# Patient Record
Sex: Male | Born: 1985 | Race: White | Hispanic: No | Marital: Married | Smoking: Current some day smoker
Health system: Southern US, Community
[De-identification: ages and names within clinical notes are randomized; demographics above are authoritative.]

## PROBLEM LIST (undated history)

## (undated) ENCOUNTER — Emergency Department (HOSPITAL_COMMUNITY): Admission: EM | Payer: Medicare HMO | Source: Home / Self Care

## (undated) DIAGNOSIS — S7290XA Unspecified fracture of unspecified femur, initial encounter for closed fracture: Secondary | ICD-10-CM

## (undated) HISTORY — PX: ANKLE SURGERY: SHX546

## (undated) HISTORY — PX: KNEE SURGERY: SHX244

## (undated) HISTORY — PX: SHOULDER SURGERY: SHX246

## (undated) HISTORY — PX: CRANIOTOMY: SHX93

---

## 2012-08-08 ENCOUNTER — Emergency Department (HOSPITAL_COMMUNITY): Payer: Medicare HMO

## 2012-08-08 ENCOUNTER — Emergency Department (HOSPITAL_COMMUNITY)
Admission: EM | Admit: 2012-08-08 | Discharge: 2012-08-08 | Disposition: A | Discharge status: Home / Self Care | Payer: Medicare HMO | Attending: Emergency Medicine | Admitting: Emergency Medicine

## 2012-08-08 DIAGNOSIS — R296 Repeated falls: Secondary | ICD-10-CM | POA: Insufficient documentation

## 2012-08-08 DIAGNOSIS — S43016A Anterior dislocation of unspecified humerus, initial encounter: Secondary | ICD-10-CM | POA: Insufficient documentation

## 2012-08-08 MED ORDER — FENTANYL CITRATE 0.05 MG/ML IJ SOLN
50.0000 ug | Freq: Once | INTRAMUSCULAR | Status: AC
  Administered 2012-08-08: 50 ug via INTRAVENOUS
  Filled 2012-08-08: qty 2

## 2012-08-08 MED ORDER — FENTANYL CITRATE 0.05 MG/ML IJ SOLN
100.0000 ug | Freq: Once | INTRAMUSCULAR | Status: AC
  Administered 2012-08-08: 100 ug via INTRAVENOUS
  Filled 2012-08-08: qty 2

## 2012-08-08 MED ORDER — OXYCODONE-ACETAMINOPHEN 5-325 MG PO TABS: 1.0000 | ORAL_TABLET | Freq: Four times a day (QID) | ORAL | Status: DC | PRN

## 2012-08-08 MED ORDER — ONDANSETRON HCL 4 MG/2ML IJ SOLN
4.0000 mg | Freq: Once | INTRAMUSCULAR | Status: AC
  Administered 2012-08-08: 4 mg via INTRAVENOUS
  Filled 2012-08-08: qty 2

## 2012-08-08 MED ORDER — PROPOFOL 10 MG/ML IV BOLUS
0.5000 mg/kg | Freq: Once | INTRAVENOUS | Status: AC
  Administered 2012-08-08: 95 mg via INTRAVENOUS
  Filled 2012-08-08: qty 1

## 2012-08-08 MED ORDER — HYDROMORPHONE HCL PF 1 MG/ML IJ SOLN
1.0000 mg | Freq: Once | INTRAMUSCULAR | Status: AC
  Administered 2012-08-08: 1 mg via INTRAVENOUS
  Filled 2012-08-08: qty 1

## 2012-08-08 MED ORDER — IBUPROFEN 800 MG PO TABS: 800.0000 mg | ORAL_TABLET | Freq: Three times a day (TID) | ORAL | Status: DC

## 2012-08-08 MED ORDER — KETOROLAC TROMETHAMINE 30 MG/ML IJ SOLN
30.0000 mg | Freq: Once | INTRAMUSCULAR | Status: AC
  Administered 2012-08-08: 30 mg via INTRAVENOUS
  Filled 2012-08-08: qty 1

## 2012-08-08 NOTE — ED Notes (Signed)
30mg propofol given

## 2012-08-08 NOTE — ED Notes (Signed)
65mg  porpofol given

## 2012-08-08 NOTE — ED Notes (Signed)
Reduction completed. 

## 2012-08-08 NOTE — ED Notes (Signed)
Patient transported to X-ray 

## 2012-08-08 NOTE — ED Provider Notes (Signed)
History     CSN: [id number]  Arrival date & time 08/08/12  4098   First MD Initiated Contact with Patient 08/08/12 2010      Chief Complaint  Patient presents with  . Shoulder Injury    (Consider location/radiation/quality/duration/timing/severity/associated sxs/prior treatment) HPI  Presents to emergency department with complaints of left shoulder pain. He states that he fell off of the end of the back head no when he caught himself falling to his left shoulder pop to have problems with shoulder and has had surgery to repair it a couple of years ago. He has not had any problems shoulder dislocating since then. The patient's blood pressure is 139/78 pulse 88. He is in pain but in no acute distress. Obvious deformity noted, no bleeding.  No past medical history on file.  No past surgical history on file.  No family history on file.  History  Substance Use Topics  . Smoking status: Not on file  . Smokeless tobacco: Not on file  . Alcohol Use: Not on file      Review of Systems   Review of Systems  Gen: no weight loss, fevers, chills, night sweats  Eyes: no discharge or drainage, no occular pain or visual changes  Nose: no epistaxis or rhinorrhea  Mouth: no dental pain, no sore throat  Neck: no neck pain  Lungs:No wheezing, coughing or hemoptysis CV: no chest pain, palpitations, dependent edema or orthopnea  Abd: no abdominal pain, nausea, vomiting  GU: no dysuria or gross hematuria  MSK:  Shoulder injury Neuro: no headache, no focal neurologic deficits  Skin: no abnormalities Psyche: negative.    Allergies  Review of patient's allergies indicates no known allergies.  Home Medications   Current Outpatient Rx  Name Route Sig Dispense Refill  . IBUPROFEN 800 MG PO TABS Oral Take 1 tablet (800 mg total) by mouth 3 (three) times daily. 21 tablet 0  . OXYCODONE-ACETAMINOPHEN 5-325 MG PO TABS Oral Take 1 tablet by mouth every 6 (six) hours as needed for pain. 15  tablet 0    BP 126/76  Pulse 65  Temp 98.5 F (36.9 C) (Oral)  Resp 16  Wt 150 lb (68.04 kg)  SpO2 100%  Physical Exam  Nursing note and vitals reviewed. Constitutional: He appears well-developed and well-nourished. No distress.  HENT:  Head: Normocephalic and atraumatic.  Eyes: Pupils are equal, round, and reactive to light.  Neck: Normal range of motion. Neck supple.  Cardiovascular: Normal rate and regular rhythm.   Pulmonary/Chest: Effort normal.  Abdominal: Soft.  Musculoskeletal:       Left shoulder: He exhibits decreased range of motion, tenderness, bony tenderness, swelling and deformity. He exhibits no effusion, no crepitus, no laceration, no pain, no spasm, normal pulse and normal strength.  Neurological: He is alert.  Skin: Skin is warm and dry.    ED Course  Procedures (including critical care time)  Labs Reviewed - No data to display Dg Shoulder Left  08/08/2012  *RADIOLOGY REPORT*  Clinical Data: Left shoulder injury  LEFT SHOULDER - 2+ VIEW  Comparison: None.  Findings: Anterior/inferior left shoulder dislocation.  No fracture is seen.  Visualized left lung is clear.  IMPRESSION: Anterior/inferior left shoulder dislocation.   Original Report Authenticated By: Charline Bills, M.D.    Dg Shoulder Left Port  08/08/2012  *RADIOLOGY REPORT*  Clinical Data: Post reduction  PORTABLE LEFT SHOULDER - 2+ VIEW  Comparison: 08/08/2012 at 2033 hours  Findings: The humeral head overlies the  glenoid on the frontal view, and is likely relocated.  Appropriate alignment is less clear on the attempted Y views, although that may be related to obliquity/difficulty with patient positioning.  Of note, the humeral head is mildly inferiorly displaced, which could reflect hemarthrosis/joint effusion.  No fracture is seen.  Visualized left lung is clear.  IMPRESSION: Humeral head is likely relocated but is inferiorly displaced, possibly related to hemarthrosis/joint effusion.   Original  Report Authenticated By: Charline Bills, M.D.      1. Anterior shoulder dislocation       MDM  Procedural sedation Performed by: Dorthula Matas Consent: Verbal consent obtained. Risks and benefits: risks, benefits and alternatives were discussed Required items: required blood products, implants, devices, and special equipment available Patient identity confirmed: arm band and provided demographic data Time out: Immediately prior to procedure a "time out" was called to verify the correct patient, procedure, equipment, support staff and site/side marked as required.  Sedation type: moderate (conscious) sedation NPO time confirmed and considedered  Sedatives: PROPOFOL  Physician Time at Bedside: 9:15pm  Vitals: Vital signs were monitored during sedation. Cardiac Monitor, pulse oximeter Patient tolerance: Patient tolerated the procedure well with no immediate complications. Comments: Pt with uneventful recovered. Returned to pre-procedural sedation baseline    Dr. Effie Shy and myself relocated patient shoulder without any complications. His pain is adequately treated in the emergency department. He recovered from the propofol without any complications. He is back to baseline now and is ready to go home. The patient is from [place] and has a orthopedic doctor there that did his previous surgery. He advises that he will call Monday morning for followup. He is placed in a shoulder immobilizer and given a prescription for Percocet and discharged home.  Pt has been advised of the symptoms that warrant their return to the ED. Patient has voiced understanding and has agreed to follow-up with the PCP or specialist.        Dorthula Matas, PA 08/08/12 2254  Dorthula Matas, PA 08/09/12 1610

## 2012-08-08 NOTE — ED Notes (Signed)
Pt sts slipped on backhoe pt caught self when falling. Pt heard shoulder pop.VSS.skin pwd

## 2012-08-09 NOTE — ED Provider Notes (Signed)
Medical screening examination/treatment/procedure(s) were conducted as a shared visit with non-physician practitioner(s) and myself.  I personally evaluated the patient during the encounter   . Recurrent left shoulder dislocation. He has a subacromial defect. He has no significant neurologic or vascular deficit.  Left shoulder dislocation, reduced under procedural sedation. He tolerated the procedure well. A shoulder immobilizer was applied.         Flint Melter, MD 08/09/12 [phone]

## 2014-04-23 ENCOUNTER — Encounter (HOSPITAL_COMMUNITY): Payer: Self-pay | Admitting: Emergency Medicine

## 2014-04-23 ENCOUNTER — Emergency Department (HOSPITAL_COMMUNITY)
Admission: EM | Admit: 2014-04-23 | Discharge: 2014-04-24 | Disposition: A | Discharge status: Home / Self Care | Payer: Self-pay | Attending: Emergency Medicine | Admitting: Emergency Medicine

## 2014-04-23 DIAGNOSIS — F17200 Nicotine dependence, unspecified, uncomplicated: Secondary | ICD-10-CM | POA: Insufficient documentation

## 2014-04-23 DIAGNOSIS — S43036A Inferior dislocation of unspecified humerus, initial encounter: Secondary | ICD-10-CM | POA: Insufficient documentation

## 2014-04-23 DIAGNOSIS — Y939 Activity, unspecified: Secondary | ICD-10-CM | POA: Insufficient documentation

## 2014-04-23 DIAGNOSIS — S43035A Inferior dislocation of left humerus, initial encounter: Secondary | ICD-10-CM

## 2014-04-23 MED ORDER — HYDROMORPHONE HCL PF 1 MG/ML IJ SOLN
1.0000 mg | Freq: Once | INTRAMUSCULAR | Status: AC
  Administered 2014-04-23: 1 mg via INTRAVENOUS
  Filled 2014-04-23: qty 1

## 2014-04-23 NOTE — ED Notes (Signed)
Per EMS pt was getting off city bus and loss balance catching himself with left arm; pt has deformity to left shoulder and c/o pain 10/10 in right hip; Pt denies LOC; A&Ox4; pt given Fent. For pain which has not helped;

## 2014-04-23 NOTE — ED Notes (Signed)
Per EMS, pt was getting off of the bus when he fell backwards and attempted to catch himself. Pt has obvious deformity to the left shoulder pt is experiencing numbness and tingling of this extremity. Pt denies LOC or hitting head. Pt also reports some pain to the right hip into the groin area. VSS. Pt received of fentanyl for pain.

## 2014-04-23 NOTE — ED Notes (Signed)
MD at bedside. 

## 2014-04-24 ENCOUNTER — Emergency Department (HOSPITAL_COMMUNITY): Payer: Medicare HMO

## 2014-04-24 ENCOUNTER — Emergency Department (HOSPITAL_COMMUNITY)
Admission: EM | Admit: 2014-04-24 | Discharge: 2014-04-24 | Disposition: A | Discharge status: Home / Self Care | Payer: Medicare HMO | Attending: Emergency Medicine | Admitting: Emergency Medicine

## 2014-04-24 ENCOUNTER — Encounter (HOSPITAL_COMMUNITY): Payer: Self-pay | Admitting: Emergency Medicine

## 2014-04-24 ENCOUNTER — Emergency Department (HOSPITAL_COMMUNITY): Payer: Self-pay

## 2014-04-24 DIAGNOSIS — Y939 Activity, unspecified: Secondary | ICD-10-CM | POA: Insufficient documentation

## 2014-04-24 DIAGNOSIS — S43036A Inferior dislocation of unspecified humerus, initial encounter: Secondary | ICD-10-CM | POA: Insufficient documentation

## 2014-04-24 DIAGNOSIS — X58XXXA Exposure to other specified factors, initial encounter: Secondary | ICD-10-CM | POA: Insufficient documentation

## 2014-04-24 DIAGNOSIS — F17200 Nicotine dependence, unspecified, uncomplicated: Secondary | ICD-10-CM | POA: Insufficient documentation

## 2014-04-24 DIAGNOSIS — S43002A Unspecified subluxation of left shoulder joint, initial encounter: Secondary | ICD-10-CM

## 2014-04-24 DIAGNOSIS — M25512 Pain in left shoulder: Secondary | ICD-10-CM

## 2014-04-24 DIAGNOSIS — Y929 Unspecified place or not applicable: Secondary | ICD-10-CM | POA: Insufficient documentation

## 2014-04-24 MED ORDER — OXYCODONE HCL 5 MG PO TABS
10.0000 mg | ORAL_TABLET | Freq: Once | ORAL | Status: AC
  Administered 2014-04-24: 10 mg via ORAL
  Filled 2014-04-24: qty 2

## 2014-04-24 MED ORDER — PROPOFOL 10 MG/ML IV BOLUS
0.5000 mg/kg | Freq: Once | INTRAVENOUS | Status: DC
Start: 2014-04-24 — End: 2014-04-24
  Filled 2014-04-24: qty 20

## 2014-04-24 MED ORDER — PROPOFOL 10 MG/ML IV BOLUS
INTRAVENOUS | Status: AC | PRN
  Administered 2014-04-24: 50 mg via INTRAVENOUS

## 2014-04-24 MED ORDER — OXYCODONE-ACETAMINOPHEN 5-325 MG PO TABS: 1.0000 | ORAL_TABLET | ORAL | Status: DC | PRN

## 2014-04-24 MED ORDER — OXYCODONE-ACETAMINOPHEN 5-325 MG PO TABS
2.0000 | ORAL_TABLET | Freq: Once | ORAL | Status: AC
  Administered 2014-04-24: 2 via ORAL
  Filled 2014-04-24: qty 2

## 2014-04-24 MED ORDER — HYDROCODONE-ACETAMINOPHEN 5-325 MG PO TABS: 1.0000 | ORAL_TABLET | ORAL | Status: DC | PRN

## 2014-04-24 MED ORDER — FENTANYL CITRATE 0.05 MG/ML IJ SOLN
75.0000 ug | Freq: Once | INTRAMUSCULAR | Status: AC
  Administered 2014-04-24: 75 ug via INTRAVENOUS
  Filled 2014-04-24: qty 2

## 2014-04-24 NOTE — ED Provider Notes (Signed)
CSN: [id number]     Arrival date & time 04/23/14  2312 History   First MD Initiated Contact with Patient 04/23/14 2333     Chief Complaint  Patient presents with  . Dislocation  . Fall     (Consider location/radiation/quality/duration/timing/severity/associated sxs/prior Treatment) HPI Comments: 28 year old male with smoking history, clavicle fracture, and left shoulder dislocation history presents with left shoulder pain after fall. Patient cut his foot while wearing crocs and landed on her left outstretched arm posteriorly. Patient felt a pop and pain in the shoulder and feels something is out of place. Pain with external and internal rotation. No head injury or loss of consciousness. No other significant injuries.  Patient is a 28 y.o. male presenting with fall. The history is provided by the patient.  Fall Pertinent negatives include no chest pain, no abdominal pain, no headaches and no shortness of breath.    History reviewed. No pertinent past medical history. History reviewed. No pertinent past surgical history. History reviewed. No pertinent family history. History  Substance Use Topics  . Smoking status: Current Some Day Smoker  . Smokeless tobacco: Not on file  . Alcohol Use: No    Review of Systems  Constitutional: Negative for fever and chills.  HENT: Negative for congestion.   Eyes: Negative for visual disturbance.  Respiratory: Negative for shortness of breath.   Cardiovascular: Negative for chest pain.  Gastrointestinal: Negative for vomiting and abdominal pain.  Genitourinary: Negative for dysuria and flank pain.  Musculoskeletal: Positive for arthralgias. Negative for back pain, neck pain and neck stiffness.  Skin: Negative for rash.  Neurological: Negative for light-headedness and headaches.      Allergies  Review of patient's allergies indicates no known allergies.  Home Medications   Prior to Admission medications   Not on File   BP 147/91  Pulse  69  Temp(Src) 97 F (36.1 C) (Oral)  Resp 18  SpO2 96% Physical Exam  Nursing note and vitals reviewed. Constitutional: He is oriented to person, place, and time. He appears well-developed and well-nourished.  HENT:  Head: Normocephalic and atraumatic.  Eyes: Conjunctivae are normal. Right eye exhibits no discharge. Left eye exhibits no discharge.  Neck: Normal range of motion. Neck supple. No tracheal deviation present.  Cardiovascular: Normal rate.   Pulmonary/Chest: Effort normal.  Abdominal: Soft.  Musculoskeletal: He exhibits no edema.  Patient has tenderness lateral and anterior shoulder with pain external and internal range of motion decreased range of motion similar. No signs of infection. Neurovascularly intact left arm. Left shoulder empty fossa.  Neurological: He is alert and oriented to person, place, and time.  Skin: Skin is warm. No rash noted.  Psychiatric: He has a normal mood and affect.    ED Course  Reduction of dislocation Date/Time: 04/24/2014 3:50 AM Performed by: Enid Skeens Authorized by: Enid Skeens Consent: Verbal consent obtained. written consent obtained. Consent given by: patient Patient identity confirmed: verbally with patient and arm band Preparation: Patient was prepped and draped in the usual sterile fashion. Patient sedated: yes Sedatives: propofol Vitals: Vital signs were monitored during sedation. Patient tolerance: Patient tolerated the procedure well with no immediate complications.   (including critical care time) Procedural sedation Performed by: Enid Skeens Consent: Verbal consent obtained. Risks and benefits: risks, benefits and alternatives were discussed Required items: required blood products, implants, devices, and special equipment available Patient identity confirmed: arm band and provided demographic data Time out: Immediately prior to procedure a "time out" was  called to verify the correct patient, procedure,  equipment, support staff and site  Sedation type: moderate (conscious) sedation NPO time confirmed, risks discussed  Sedatives: Propofol   Physician Time at Bedside: 20 minutes  Vitals: Vital signs were monitored during sedation. Cardiac Monitor, pulse oximeter Patient tolerance: Patient tolerated the procedure well with no immediate complications. Comments: Pt with uneventful recovered. Returned to pre-procedural sedation baseline  Procedural sedation Performed by: Enid SkeensZAVITZ, Mikalia Fessel M Consent: Verbal consent obtained. Risks and benefits: risks, benefits and alternatives were discussed Required items: required blood products, implants, devices, and special equipment available Patient identity confirmed: arm band and provided demographic data Time out: Immediately prior to procedure a "time out" was called to verify the correct patient, procedure, equipment, support staff and site  Sedation type: moderate (conscious) sedation NPO time confirmed, risks discussed  Sedatives: Propofol   Physician Time at Bedside: 15 minutes  Vitals: Vital signs were monitored during sedation. Cardiac Monitor, pulse oximeter Patient tolerance: Patient tolerated the procedure well with no immediate complications. Comments: Pt with uneventful recovered. Returned to pre-procedural sedation baseline  Ultrasound bedside limited musculoskeletal left shoulder Indication dislocation and pain Findings head of left humerus out of socket with empty fossa and after reduction head of humerus and fossa temporarily until self redislocated with rolling patient. Images saved Performed by myself    Labs Review Labs Reviewed - No data to display  Imaging Review No results found.   EKG Interpretation None      MDM   Final diagnoses:  Inferior dislocation of left shoulder   there is Concern for shoulder dislocation. Pain medicines IV and x-ray pending.  X-ray reviewed with inferior dislocation. Repeat IV  pain medicines given pain improved on recheck. Propofol utilized and initial reduction attempt performed however post x-ray showed still inferior dislocated reviewed. Again discussed risks and benefits of procedural sedation reduction attempt and patient agreed. Released again and different technique used with more traction countertraction and clinically shoulder reduced and ultrasound verified. Initial x-ray showed improvement however patient rolled and second x-ray and clinically shoulder appeared to be redislocated.  Patient had normal distal pulses and sensation in left extremity after each reduction attempt. Patient placed in a sling. Plan for very x-ray and consult orthopedic surgery.  Orthopedic surgeon came in to the ER to assist and evaluate the patient. He agrees that the patient's shoulders unstable and he was able to relocate and dislocate during his exam. Patient has an orthopedic surgeon in [place] and will followup there. Sling placed and pain meds for home.  Results and differential diagnosis were discussed with the patient/parent/guardian. Close follow up outpatient was discussed, comfortable with the plan.   Medications  propofol (DIPRIVAN) 10 mg/mL bolus/IV push 37.4 mg (not administered)  HYDROmorphone (DILAUDID) injection 1 mg (1 mg Intravenous Given 04/23/14 2346)  fentaNYL (SUBLIMAZE) injection 75 mcg (75 mcg Intravenous Given 04/24/14 0126)  propofol (DIPRIVAN) 10 mg/mL bolus/IV push ( Intravenous Stopped 04/24/14 0212)  propofol (DIPRIVAN) 10 mg/mL bolus/IV push ( Intravenous Stopped 04/24/14 0324)    Filed Vitals:   04/24/14 0445 04/24/14 0500 04/24/14 0515 04/24/14 0530  BP: 111/69 106/64 105/62 108/63  Pulse: 54 49 49 47  Temp:      TempSrc:      Resp: 17 11 13 13   Weight:      SpO2: 97% 97% 97% 98%        Enid SkeensJoshua M Aleasha Fregeau, MD 04/24/14 [phone]

## 2014-04-24 NOTE — ED Notes (Signed)
Patient transported to X-ray 

## 2014-04-24 NOTE — ED Notes (Signed)
Consent form at bedside.  

## 2014-04-24 NOTE — Discharge Instructions (Signed)
1. Medications: percocet, usual home medications 2. Treatment: rest, drink plenty of fluids, wear sling 3. Follow Up: Please followup with your orthopedist in [place] or [facility] for discussion of your diagnoses and further evaluation after today's visit; if you do not have a primary care doctor use the resource guide provided to find one;     Shoulder Dislocation Your shoulder is made up of three bones: the collar bone (clavicle); the shoulder blade (scapula), which includes the socket (glenoid cavity); and the upper arm bone (humerus). Your shoulder joint is the place where these bones meet. Strong, fibrous tissues hold these bones together (ligaments). Muscles and strong, fibrous tissues that connect the muscles to these bones (tendons) allow your arm to move through this joint. The range of motion of your shoulder joint is more extensive than most of your other joints, and the glenoid cavity is very shallow. That is the reason that your shoulder joint is one of the most unstable joints in your body. It is far more prone to dislocation than your other joints. Shoulder dislocation is when your humerus is forced out of your shoulder joint. CAUSES Shoulder dislocation is caused by a forceful impact on your shoulder. This impact usually is from an injury, such as a sports injury or a fall. SYMPTOMS Symptoms of shoulder dislocation include:  Deformity of your shoulder.  Intense pain.  Inability to move your shoulder joint.  Numbness, weakness, or tingling around your shoulder joint (your neck or down your arm).  Bruising or swelling around your shoulder. DIAGNOSIS In order to diagnose a dislocated shoulder, your caregiver will perform a physical exam. Your caregiver also may have an X-ray exam done to see if you have any broken bones. Magnetic resonance imaging (MRI) is a procedure that sometimes is done to help your caregiver see any damage to the soft tissues around your shoulder, particularly  your rotator cuff tendons. Additionally, your caregiver also may have electromyography done to measure the electrical discharges produced in your muscles if you have signs or symptoms of nerve damage. TREATMENT A shoulder dislocation is treated by placing the humerus back in the joint (reduction). Your caregiver does this either manually (closed reduction), by moving your humerus back into the joint through manipulation, or through surgery (open reduction). When your humerus is back in place, severe pain should improve almost immediately. You also may need to have surgery if you have a weak shoulder joint or ligaments, and you have recurring shoulder dislocations, despite rehabilitation. In rare cases, surgery is necessary if your nerves or blood vessels are damaged during the dislocation. After your reduction, your arm will be placed in a shoulder immobilizer or sling to keep it from moving. Your caregiver will have you wear your shoulder immoblizer or sling for 3 days to 3 weeks, depending on how serious your dislocation is. When your shoulder immobilizer or sling is removed, your caregiver may prescribe physical therapy to help improve the range of motion in your shoulder joint. HOME CARE INSTRUCTIONS  The following measures can help to reduce pain and speed up the healing process:  Rest your injured joint. Do not move it. Avoid activities similar to the one that caused your injury.  Apply ice to your injured joint for the first day or two after your reduction or as directed by your caregiver. Applying ice helps to reduce inflammation and pain.  Put ice in a plastic bag.  Place a towel between your skin and the bag.  Leave the  ice on for 15-20 minutes at a time, every 2 hours while you are awake.  Exercise your hand by squeezing a soft ball. This helps to eliminate stiffness and swelling in your hand and wrist.  Take over-the-counter or prescription medicine for pain or discomfort as told by  your caregiver. SEEK IMMEDIATE MEDICAL CARE IF:   Your shoulder immobilizer or sling becomes damaged.  Your pain becomes worse rather than better.  You lose feeling in your arm or hand, or they become white and cold. MAKE SURE YOU:   Understand these instructions.  Will watch your condition.  Will get help right away if you are not doing well or get worse. Document Released: 07/30/2001 Document Revised: 01/27/2012 Document Reviewed: 08/25/2011 [facility] Patient Information 2014 [employer] [place].

## 2014-04-24 NOTE — ED Notes (Signed)
X-ray at bedside

## 2014-04-24 NOTE — ED Notes (Signed)
Pt. returned from XR. 

## 2014-04-24 NOTE — ED Notes (Signed)
Respiratory Tech at bedside.  

## 2014-04-24 NOTE — Discharge Instructions (Signed)
Followup with your local orthopedic surgeon. Take ibuprofen every 6 hours and use ice for pain. Keep shoulder in a sling until you see the specialist. For severe pain take norco or vicodin however realize they have the potential for addiction and it can make you sleepy and has tylenol in it.  No operating machinery while taking.  If you were given medicines take as directed.  If you are on coumadin or contraceptives realize their levels and effectiveness is altered by many different medicines.  If you have any reaction (rash, tongues swelling, other) to the medicines stop taking and see a physician.   Please follow up as directed and return to the ER or see a physician for new or worsening symptoms.  Thank you. Filed Vitals:   04/24/14 0445 04/24/14 0500 04/24/14 0515 04/24/14 0530  BP: 111/69 106/64 105/62 108/63  Pulse: 54 49 49 47  Temp:      TempSrc:      Resp: 17 11 13 13   Weight:      SpO2: 97% 97% 97% 98%    Shoulder Dislocation Your shoulder is made up of three bones: the collar bone (clavicle); the shoulder blade (scapula), which includes the socket (glenoid cavity); and the upper arm bone (humerus). Your shoulder joint is the place where these bones meet. Strong, fibrous tissues hold these bones together (ligaments). Muscles and strong, fibrous tissues that connect the muscles to these bones (tendons) allow your arm to move through this joint. The range of motion of your shoulder joint is more extensive than most of your other joints, and the glenoid cavity is very shallow. That is the reason that your shoulder joint is one of the most unstable joints in your body. It is far more prone to dislocation than your other joints. Shoulder dislocation is when your humerus is forced out of your shoulder joint. CAUSES Shoulder dislocation is caused by a forceful impact on your shoulder. This impact usually is from an injury, such as a sports injury or a fall. SYMPTOMS Symptoms of shoulder  dislocation include:  Deformity of your shoulder.  Intense pain.  Inability to move your shoulder joint.  Numbness, weakness, or tingling around your shoulder joint (your neck or down your arm).  Bruising or swelling around your shoulder. DIAGNOSIS In order to diagnose a dislocated shoulder, your caregiver will perform a physical exam. Your caregiver also may have an X-ray exam done to see if you have any broken bones. Magnetic resonance imaging (MRI) is a procedure that sometimes is done to help your caregiver see any damage to the soft tissues around your shoulder, particularly your rotator cuff tendons. Additionally, your caregiver also may have electromyography done to measure the electrical discharges produced in your muscles if you have signs or symptoms of nerve damage. TREATMENT A shoulder dislocation is treated by placing the humerus back in the joint (reduction). Your caregiver does this either manually (closed reduction), by moving your humerus back into the joint through manipulation, or through surgery (open reduction). When your humerus is back in place, severe pain should improve almost immediately. You also may need to have surgery if you have a weak shoulder joint or ligaments, and you have recurring shoulder dislocations, despite rehabilitation. In rare cases, surgery is necessary if your nerves or blood vessels are damaged during the dislocation. After your reduction, your arm will be placed in a shoulder immobilizer or sling to keep it from moving. Your caregiver will have you wear your shoulder immoblizer  or sling for 3 days to 3 weeks, depending on how serious your dislocation is. When your shoulder immobilizer or sling is removed, your caregiver may prescribe physical therapy to help improve the range of motion in your shoulder joint. HOME CARE INSTRUCTIONS  The following measures can help to reduce pain and speed up the healing process:  Rest your injured joint. Do not  move it. Avoid activities similar to the one that caused your injury.  Apply ice to your injured joint for the first day or two after your reduction or as directed by your caregiver. Applying ice helps to reduce inflammation and pain.  Put ice in a plastic bag.  Place a towel between your skin and the bag.  Leave the ice on for 15-20 minutes at a time, every 2 hours while you are awake.  Exercise your hand by squeezing a soft ball. This helps to eliminate stiffness and swelling in your hand and wrist.  Take over-the-counter or prescription medicine for pain or discomfort as told by your caregiver. SEEK IMMEDIATE MEDICAL CARE IF:   Your shoulder immobilizer or sling becomes damaged.  Your pain becomes worse rather than better.  You lose feeling in your arm or hand, or they become white and cold. MAKE SURE YOU:   Understand these instructions.  Will watch your condition.  Will get help right away if you are not doing well or get worse. Document Released: 07/30/2001 Document Revised: 01/27/2012 Document Reviewed: 08/25/2011 [facility] Patient Information 2014 [place], [place].

## 2014-04-24 NOTE — ED Notes (Signed)
Pt requesting to speak with charge RN.  Gloris Manchester, RN notified.

## 2014-04-24 NOTE — ED Notes (Signed)
Closed reduction unsuccessful; Will have procedure repeated; Ortho paged back to bedside

## 2014-04-24 NOTE — Consult Note (Signed)
Reason for Consult:  Left shoulder pain Referring Physician:  Dr. Jason Foster  Leonard Foster is an 28 y.o. male.  HPI:  28 y/o RHD male with PMH of multiple shoulder dislocations and surgery fell last night on his outstretched hand.  He underwent closed reduction in the ER by Dr. Jodi Foster under propofol sedation.  His post reduction films appear to show continued subluxation inferiorly.  He says the shoulder feels better but is still sore.  He denies f/c/n/v/wt loss.  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  He had a left shoulder reconstruction by Dr. Kandis Foster in [place] about 2 years ago.  PMH:  none PSH: as above  FH:  noncontrib.  Social History:  reports that he has been smoking.  He does not have any smokeless tobacco history on file. He reports that he does not drink alcohol. His drug history is not on file.  Allergies: No Known Allergies  Medications: I have reviewed the patient's current medications.  No results found for this or any previous visit (from the past 48 hour(s)).  Dg Shoulder Left  04/24/2014   CLINICAL DATA:  Clinical dislocation of the left shoulder; history of previous shoulder surgery.  EXAM: LEFT SHOULDER - 2+ VIEW  COMPARISON:  Portable shoulder series of August 08, 2012  FINDINGS: The humeral head is inferiorly positioned with respect to the glenoid. There is no acute fracture. There 2 metallic screws present through the inferior aspect of the glenoid. [facility] is intact. The observed portions of the left clavicle and upper left ribs are normal.  IMPRESSION: The patient has sustained inferior dislocation of the humeral head with respect to the glenoid.   Electronically Signed   By: Leonard  Foster   On: 04/24/2014 00:19   Dg Shoulder Left Port  04/24/2014   CLINICAL DATA:  Post reduction.  EXAM: PORTABLE LEFT SHOULDER - 2+ VIEW  COMPARISON:  Left shoulder radiograph April 24, 2014 at 2:29 a.m.  FINDINGS: Inferiorly subluxed left  humeral head on the Y view without frank dislocation. No fracture deformity; status post bony [place] surgical repair. No destructive bony lesions. Periarticular soft tissue planes are nonsuspicious.  IMPRESSION: Inferiorly subluxed humeral head without frank dislocation.   Electronically Signed   By: Leonard Metroourtnay  Foster   On: 04/24/2014 03:53   Dg Shoulder Left Port  04/24/2014   CLINICAL DATA:  Postreduction films.  EXAM: PORTABLE LEFT SHOULDER - 2+ VIEW  COMPARISON:  Earlier film, same date.  FINDINGS: Persistent dislocation. No acute fracture. Stable surgical changes.  IMPRESSION: Persistent dislocation.   Electronically Signed   By: Leonard ChampagneMark  Foster M.D.   On: 04/24/2014 02:43    ROS:  No recent f/c/nv/w/t loss PE:  Blood pressure 112/72, pulse 46, temperature 97 F (36.1 C), temperature source Oral, resp. rate 14, weight 74.844 kg (165 lb), SpO2 97.00%. wn wd male in nad.  A and O x 4.  Mood and affect normal.  EOMi.  Resp unlabored.  L shoulder with healed surgical incisions anteriorly.  No lymphadenoapthy.  No sulcus sign.  Symmetric appearance to the shoulder as compared to the R Shoulder.  2+ radial pulse.  Feels LT in the radial, median and ulnar nerve dist.  Subjective decreased sens to LT in the radial n dist.  Axillary n sens to LT intact.  5/5 strength in grip.  I can passively abduct the shoulder to 90 deg and ER to 90 without significant pain.  He has passive IR and ER that is "sore", but he stays in a reduced position.  I can recreate his sulcus sign with longitudinal traction.  My review of the post reduction x rays reveals subluxation of the glenoid inferiorly with previous bone block surgery at the ant-inf glenoid.  2 screws in place.    Assessment/Plan: L shoulder dislocation s/p remote reconstruction - In light of the patient's physical exam, I believe the patient is currently reduced.  He appears to have an unstable shoulder and has probably disrupted his surgical reconstruction.   I believe he can be safely discharged home in a sling for f/u with Leonard Foster when he returns home to [place].  He understands this plan and agrees.  D/w Dr. Jodi Mourning.  Leonard Foster 04/24/2014, 7:06 AM

## 2014-04-24 NOTE — ED Notes (Signed)
Pt states to this RN and Traci, Consulting civil engineer that he will leave this hospital and go to another hospital.  Pt states he will go to [facility].  This RN suggested if he is going to travel to another hospital he could consider going to a [facility] where his orthopedic surgeon is affiliated.  Pt seems unwilling to do that.  This RN discussed with Micheline Maze, MD the situation at hand.  Pain meds requested for pt.  Order for Percocet received.

## 2014-04-24 NOTE — ED Provider Notes (Signed)
CSN: [id number]     Arrival date & time 04/24/14  1720 History   First MD Initiated Contact with Patient 04/24/14 1836     Chief Complaint  Patient presents with  . Shoulder Injury     (Consider location/radiation/quality/duration/timing/severity/associated sxs/prior Treatment) Patient is a 28 y.o. male presenting with shoulder injury. The history is provided by the patient and medical records. No language interpreter was used.  Shoulder Injury Associated symptoms include arthralgias and joint swelling. Pertinent negatives include no abdominal pain, chest pain, chills, fever, headaches, nausea, neck pain, numbness or vomiting.    Leonard Foster is a 28 y.o. male  with a hx of left shoulder reconstruction presents to the Emergency Department complaining of acute, persistent left shoulder dislocation onset yesterday afternoon. Patient reports he was seen in [place], spine and the shoulder was "reduced" but that it continues to dislocate. He reports that he was seen by an orthopedic surgeon who told him he needed to followup with his initial orthopedic surgeon. He reports he cannot afford his pain medications and he does not know what to do. He reports when he moves his left arm he has paresthesias in the left pinky. He endorses decreased sensation to the left arm and continued pain in the left shoulder. Nothing seems to make his symptoms better or worse.  Pt denies fever, chills, headache, neck pain, chest pain shortness of breath, color change to the hands, weakness in the arms.     History reviewed. No pertinent past medical history. History reviewed. No pertinent past surgical history. No family history on file. History  Substance Use Topics  . Smoking status: Current Some Day Smoker  . Smokeless tobacco: Not on file  . Alcohol Use: No    Review of Systems  Constitutional: Negative for fever and chills.  Eyes: Negative for visual disturbance.  Respiratory: Negative for shortness of  breath.   Cardiovascular: Negative for chest pain.  Gastrointestinal: Negative for nausea, vomiting and abdominal pain.  Musculoskeletal: Positive for arthralgias and joint swelling. Negative for back pain, neck pain and neck stiffness.  Skin: Negative for color change and wound.  Allergic/Immunologic: Negative for immunocompromised state.  Neurological: Negative for light-headedness, numbness and headaches.  Hematological: Does not bruise/bleed easily.  Psychiatric/Behavioral: The patient is not nervous/anxious.   All other systems reviewed and are negative.     Allergies  Review of patient's allergies indicates no known allergies.  Home Medications   Prior to Admission medications   Medication Sig Start Date End Date Taking? Authorizing Provider  HYDROcodone-acetaminophen (NORCO) 5-325 MG per tablet Take 1-2 tablets by mouth every 4 (four) hours as needed. 04/24/14   Enid Skeens, MD  oxyCODONE-acetaminophen (PERCOCET/ROXICET) 5-325 MG per tablet Take 1 tablet by mouth every 4 (four) hours as needed for severe pain. May take 2 tablets PO q 6 hours for severe pain - Do not take with Tylenol as this tablet already contains tylenol 04/24/14   [place]pet[place] Elling, PA-C   BP 142/96  Pulse 56  Temp(Src) 98 F (36.7 C) (Oral)  Resp 20  SpO2 98% Physical Exam  Nursing note and vitals reviewed. Constitutional: He appears well-developed and well-nourished. No distress.  Awake, alert, nontoxic appearance  HENT:  Head: Normocephalic and atraumatic.  Mouth/Throat: Oropharynx is clear and moist. No oropharyngeal exudate.  Eyes: Conjunctivae are normal. No scleral icterus.  Neck: Normal range of motion. Neck supple.  Cardiovascular: Normal rate, regular rhythm, normal heart sounds and intact distal pulses.   Pulses:  Radial pulses are 2+ on the right side, and 2+ on the left side.  Capillary refill less than 3 seconds No cyanosis of the fingers Fingers warm to touch    Pulmonary/Chest: Effort normal and breath sounds normal. No respiratory distress. He has no wheezes.  Abdominal: Soft. Bowel sounds are normal. He exhibits no mass. There is no tenderness. There is no rebound and no guarding.  Musculoskeletal: He exhibits tenderness. He exhibits no edema.       Left shoulder: He exhibits decreased range of motion, tenderness, deformity, pain and decreased strength. He exhibits normal pulse.       Left elbow: Normal.       Left wrist: Normal.       Left hand: He exhibits normal range of motion, normal two-point discrimination, normal capillary refill, no deformity, no laceration and no swelling. Normal sensation noted. Decreased sensation is not present in the ulnar distribution, is not present in the medial redistribution and is not present in the radial distribution. Normal strength noted.  ROM: Decreased active range of motion of the left shoulder; palpable deformity; internal and external rotation to 90 with mild pain; positive sulcus sign with longitudinal traction   Neurological: He is alert. Coordination normal.  Sensation intact in the left radial, median and ulnar distributions; subjectively decreased sensation in the left ulnar distribution but able to distinguish sharp and dull Strength 5/5 grip strength in the left hand, 5/5 strength in flexion and extension of the elbow; no active range of motion or strength in the left shoulder due to pain  Skin: Skin is warm and dry. He is not diaphoretic.  No tenting of the skin  Psychiatric: His speech is normal. Judgment and thought content normal. His mood appears anxious. He is agitated. Cognition and memory are normal.    ED Course  Procedures (including critical care time) Labs Review Labs Reviewed - No data to display  Imaging Review Dg Shoulder Left  04/24/2014   CLINICAL DATA:  Left shoulder pain.  History of prior surgery.  EXAM: LEFT SHOULDER - 2+ VIEW  COMPARISON:  Plain films left shoulder  04/24/2014.  FINDINGS: Two screws are seen in the anterior aspect of the glenoid. The humeral head is inferiorly subluxed. The glenoid appears dysplastic. No fracture is identified.  IMPRESSION: Inferior subluxation of the humeral head may be secondary to joint effusion and/or joint instability. No fracture is identified.  Dysplastic glenoid.   Electronically Signed   By: Drusilla Kannerhomas  Dalessio M.D.   On: 04/24/2014 20:40     EKG Interpretation None      MDM   Final diagnoses:  Shoulder subluxation, left  Arthralgia of left shoulder region   Leonard Foster presents with persistently dislocated shoulder after numerous attempted reductions last night and evaluation by Dr. Victorino DikeHewitt.  Orthopedic surgery's note indicates possible disruption to the previous surgical stabilization.  Patient was to followup with his orthopedic surgeon in [place] reports she is no way to get there until his father comes from [place] tomorrow. We'll give by mouth pain control and obtain left shoulder film to assess degree of dislocation at this time as compared to last night.  9:59 PM Repeat x-ray of the left shoulder shows persistent subluxation, but no frank dislocation. Patient with passive range of motion without significant pain to the left shoulder.  Discussed these findings with the patient. Based on record review and patient's current exam do not feel like sedation and attempts to reduce the shoulder further is  in the patient's best interest.  Will give repeat pain control here in the emergency department.  I recommend close followup with patient orthopedic surgeon in [place] or [place].  Patient will be given pain medication prescription as needed.  It has been determined that no acute conditions requiring further emergency intervention are present at this time. The patient/guardian have been advised of the diagnosis and plan. We have discussed signs and symptoms that warrant return to the ED, such as changes or  worsening in symptoms such as coldness in the hands, loss of sensation or significant weakness in the left arm..   Vital signs are stable at discharge.   BP 142/96  Pulse 56  Temp(Src) 98 F (36.7 C) (Oral)  Resp 20  SpO2 98%  Patient/guardian has voiced understanding and agreed to follow-up with the PCP or specialist.      Dierdre Forth, PA-C 04/24/14 2203

## 2014-04-24 NOTE — ED Notes (Signed)
Ortho paged to bedside for procedure; pt connected to [facility] for monitoring

## 2014-04-24 NOTE — ED Notes (Signed)
Respiratory paged to bedside for closed reduction procedure

## 2014-04-24 NOTE — ED Notes (Signed)
Bed: WA17 Expected date:  Expected time:  Means of arrival:  Comments: Triage fast track

## 2014-04-24 NOTE — ED Notes (Signed)
Per Charge pt ok to stay in room til family arrives from [facility]; ETA around 12pm; discharge instructions has been read and given to pt at bedside; pt still needs to sign out once leaving

## 2014-04-24 NOTE — ED Notes (Addendum)
Pt states he was seen [facility] for dislocated shoulder yesterdy, pt states he was discharged with his shoulder dislocated. Pt here c/o left shoulder dislocation and pain. Pt states hand is beginning to go numb and change color. Pt is from [place] and family is coming tomorrow.

## 2014-04-25 NOTE — ED Provider Notes (Signed)
Medical screening examination/treatment/procedure(s) were performed by non-physician practitioner and as supervising physician I was immediately available for consultation/collaboration.  Atlantis Delong T Billyjack Trompeter, MD 04/25/14 0959 

## 2015-03-15 ENCOUNTER — Emergency Department (HOSPITAL_COMMUNITY)
Admission: EM | Admit: 2015-03-15 | Discharge: 2015-03-15 | Disposition: A | Discharge status: Home / Self Care | Payer: Medicare HMO | Attending: Emergency Medicine | Admitting: Emergency Medicine

## 2015-03-15 ENCOUNTER — Emergency Department (HOSPITAL_COMMUNITY): Payer: Medicare HMO

## 2015-03-15 ENCOUNTER — Encounter (HOSPITAL_COMMUNITY): Payer: Self-pay | Admitting: *Deleted

## 2015-03-15 DIAGNOSIS — W19XXXA Unspecified fall, initial encounter: Secondary | ICD-10-CM

## 2015-03-15 DIAGNOSIS — Y9389 Activity, other specified: Secondary | ICD-10-CM | POA: Insufficient documentation

## 2015-03-15 DIAGNOSIS — Y998 Other external cause status: Secondary | ICD-10-CM | POA: Insufficient documentation

## 2015-03-15 DIAGNOSIS — S7001XA Contusion of right hip, initial encounter: Secondary | ICD-10-CM | POA: Insufficient documentation

## 2015-03-15 DIAGNOSIS — W14XXXA Fall from tree, initial encounter: Secondary | ICD-10-CM | POA: Insufficient documentation

## 2015-03-15 DIAGNOSIS — Z79899 Other long term (current) drug therapy: Secondary | ICD-10-CM | POA: Insufficient documentation

## 2015-03-15 DIAGNOSIS — S43001D Unspecified subluxation of right shoulder joint, subsequent encounter: Secondary | ICD-10-CM

## 2015-03-15 DIAGNOSIS — Z720 Tobacco use: Secondary | ICD-10-CM | POA: Insufficient documentation

## 2015-03-15 DIAGNOSIS — Y9289 Other specified places as the place of occurrence of the external cause: Secondary | ICD-10-CM | POA: Insufficient documentation

## 2015-03-15 DIAGNOSIS — M87051 Idiopathic aseptic necrosis of right femur: Secondary | ICD-10-CM

## 2015-03-15 DIAGNOSIS — M8788 Other osteonecrosis, other site: Secondary | ICD-10-CM | POA: Insufficient documentation

## 2015-03-15 DIAGNOSIS — S43032D Inferior subluxation of left humerus, subsequent encounter: Secondary | ICD-10-CM | POA: Insufficient documentation

## 2015-03-15 DIAGNOSIS — R52 Pain, unspecified: Secondary | ICD-10-CM

## 2015-03-15 HISTORY — DX: Unspecified fracture of unspecified femur, initial encounter for closed fracture: S72.90XA

## 2015-03-15 MED ORDER — OXYCODONE-ACETAMINOPHEN 5-325 MG PO TABS
2.0000 | ORAL_TABLET | Freq: Once | ORAL | Status: AC
  Administered 2015-03-15: 2 via ORAL
  Filled 2015-03-15: qty 2

## 2015-03-15 MED ORDER — HYDROCODONE-ACETAMINOPHEN 5-325 MG PO TABS: 1.0000 | ORAL_TABLET | ORAL | Status: DC | PRN

## 2015-03-15 NOTE — Discharge Instructions (Signed)
Avascular Necrosis Avascular necrosis is a disease resulting from the temporary or permanent loss of the blood supply to the bones. Without blood, the bone tissue dies and causes the bone to become soft. If the process involves the bone near a joint, it may lead to collapse of the joint surface. This disease is also known as:  Osteonecrosis.  Aseptic necrosis.  Ischemic bone necrosis. Avascular necrosis most commonly affects the ends (epiphysis) of long bones. The femur, the bone extending from the knee joint to the hip joint, is the bone most commonly involved. The disease may affect 1 bone, more than 1 bone at the same time, more than 1 bone at different times. It affects men and women equally. Avascular necrosis occurs at any age. But it is more common between the ages of 56 and 47 years. SYMPTOMS  In early stages patients may not have any symptoms. But as the disease progresses, joint pain generally develops. At first there is pain when putting weight on the affected joint, and then when resting. Pain usually develops gradually. It may be mild or severe. As the disease progresses and the bone and surrounding joint surface collapses, pain may develop or increase dramatically. Pain may be severe enough to limit range of motion in the affected joint. The period of time between the first symptoms and loss of joint function is different for each patient. This can range from several months to more than a year. Disability depends on:  What part of the bone is affected.  How large an area is involved.  How effectively the bone repairs itself.  If other illnesses are present.  If you are being treated for cancer with medications (chemotherapy).  Radiation.  The cause of the avascular necrosis. DIAGNOSIS  The diagnosis of aseptic necrosis is usually made by:  Taking a history.  Doing an exam.  Taking X-rays. (If X-rays are normal, an MRI may be required.)  Sometimes further blood work and  specialized studies may be necessary. TREATMENT  Treatment for this disease is necessary to maintain joint function. If untreated, most patients will suffer severe pain and limitation in movement within 2 years. Several treatments are available that help prevent further bone and joint damage. They can also reduce pain. To determine the most appropriate treatment, the caregiver considers the following aspects of a patient's disease:  The age of the patient.  The stage of the disease (early or late).  The location and amount of bone affected. It may be a small or large area.  The underlying cause of avascular necrosis. The goals in treatment are to:  Improve the patient's use of the affected joint.  Stop further damage to the bone.  Improve bone and joint survival. Your caregiver may use one or more of the following treatments:  Reduced weight bearing. If avascular necrosis is diagnosed early, the caregiver may begin treatment by having the patient limit weight on the affected joint. The caregiver may recommend limiting activities or using crutches. In some cases, reduced weight bearing can slow the damage caused by the disease and permit natural healing. When combined with medication to reduce pain, reduced weight bearing can be an effective way to avoid or delay surgery for some patients. Most patients eventually will need surgery to reconstruct the joint.  Core decompression. Core decompression works best in people who are in the earliest stages of avascular necrosis, before the collapse of the joint. This procedure often can reduce pain and slow the progression  of bone and joint destruction in these patients. This surgical procedure removes the inner layer of bone, which:  Reduces pressure within the bone.  Increases blood flow to the bone.  Allows more blood vessels to form.  Reduces pain.  Osteotomy. This surgical procedure re-shapes the bone to reduce stress on the affected area  of the joint. There is a lengthy recovery period. The patient's activities are very limited for 3 to 12 months after an osteotomy. This procedure is most effective for younger patients with advanced avascular necrosis, and those with a large area of affected bone.  Bone Graft. A bone graft may be used to support a joint after core decompression. Bone grafting is surgery that transplants healthy bone from one part of the patient, such as the leg, to the diseased area. Sometimes the bone is taken with it's blood vessels which are attached to local blood vessels near the area of bone collapse. This is called a vascularized bone graft. There is a lengthy recovery period after a bone graft, usually from 6 to 12 months. This procedure is technically complex.  Arthroplasty. Arthroplasty is also known as total joint replacement. Total joint replacement is used in late-stage avascular necrosis, and when the joint is deformed. In this surgery, the diseased joint is replaced with artificial parts. It may be recommended for people who are not good candidates for other treatments, such as patients who may not do well with repeated attempts to preserve the joint. Various types of replacements are available, and patients should discuss specific needs with their caregiver. New treatments being tried include:  The use of medications.  Electrical stimulation.  Combination therapies to increase the growth of new bone and blood vessels. Document Released: 04/26/2002 Document Revised: 01/27/2012 Document Reviewed: 01/12/2014 [facility] Patient Information 2015 [employer]. This information is not intended to replace advice given to you by your health care provider. Make sure you discuss any questions you have with your health care provider. See dr Victorino DikeHewitt today

## 2015-03-15 NOTE — ED Notes (Signed)
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 Pt denies LOC - admits to rt hip pain, left shoulder and left rib pain.

## 2015-03-15 NOTE — ED Provider Notes (Signed)
CSN: [id number]     Arrival date & time 03/15/15  0039 History   First MD Initiated Contact with Patient 03/15/15 0533     Chief Complaint  Patient presents with  . Fall  . Hip Pain  . Shoulder Pain     (Consider location/radiation/quality/duration/timing/severity/associated sxs/prior Treatment) HPI Comments: Patient presents stating that he fell out of a tree approximately 10 AM yesterday.  He now states that he has right hip pain and left shoulder pain.  He has not taken any medication for his discomfort.  He has a history of previous left shoulder surgery.  Patient is a 29 y.o. male presenting with fall, hip pain, and shoulder pain. The history is provided by the patient.  Fall This is a recurrent problem. The current episode started yesterday. The problem occurs constantly. The problem has been unchanged. Associated symptoms include arthralgias. Pertinent negatives include no weakness. The symptoms are aggravated by exertion. He has tried nothing for the symptoms. The treatment provided no relief.  Hip Pain Associated symptoms include arthralgias. Pertinent negatives include no weakness.  Shoulder Pain   Past Medical History  Diagnosis Date  . Femur fracture    Past Surgical History  Procedure Laterality Date  . Knee surgery    . Craniotomy    . Ankle surgery    . Shoulder surgery     History reviewed. No pertinent family history. History  Substance Use Topics  . Smoking status: Current Some Day Smoker    Types: Cigarettes  . Smokeless tobacco: Not on file  . Alcohol Use: No    Review of Systems  Musculoskeletal: Positive for arthralgias.  Skin: Negative for wound.  Neurological: Negative for weakness.  All other systems reviewed and are negative.     Allergies  Ketamine and Zofran  Home Medications   Prior to Admission medications   Medication Sig Start Date End Date Taking? Authorizing Provider  acetaminophen (TYLENOL) 500 MG tablet Take 1,000 mg by  mouth every 6 (six) hours as needed for mild pain.   Yes Historical Provider, MD  CREATINE PO Take 1 packet by mouth daily as needed (supplement).   Yes Historical Provider, MD  HYDROcodone-acetaminophen (NORCO) 5-325 MG per tablet Take 1-2 tablets by mouth every 4 (four) hours as needed. Patient not taking: Reported on 03/15/2015 04/24/14   Blane Ohara, MD  oxyCODONE-acetaminophen (PERCOCET/ROXICET) 5-325 MG per tablet Take 1 tablet by mouth every 4 (four) hours as needed for severe pain. May take 2 tablets PO q 6 hours for severe pain - Do not take with Tylenol as this tablet already contains tylenol Patient not taking: Reported on 03/15/2015 04/24/14   Dahlia Client Muthersbaugh, PA-C   BP 136/82 mmHg  Pulse 109  Temp(Src) 97.8 F (36.6 C) (Oral)  Resp 16  Ht  (1.753 m)  Wt 145 lb (65.772 kg)  BMI 21.40 kg/m2  SpO2 99% Physical Exam  Constitutional: He appears well-developed and well-nourished.  HENT:  Head: Normocephalic.  Eyes: Pupils are equal, round, and reactive to light.  Neck: Normal range of motion.  Cardiovascular: Normal rate.   Pulmonary/Chest: Effort normal.  Musculoskeletal: He exhibits tenderness. He exhibits no edema.       Left shoulder: He exhibits decreased range of motion, effusion, deformity and pain. He exhibits no swelling, normal pulse and normal strength.       Arms: Neurological: He is alert.  Skin: Skin is warm.  Nursing note and vitals reviewed.   ED Course  Procedures (  including critical care time) Labs Review Labs Reviewed - No data to display  Imaging Review Dg Ribs Unilateral W/chest Left  03/15/2015   CLINICAL DATA:  29 year old male status post 10 foot fall from tree  EXAM: LEFT RIBS AND CHEST - 3+ VIEW  COMPARISON:  Prior radiographs of the left shoulder 04/24/2014  FINDINGS: Surgical changes of prior glenoid repair. The left humeral head is low lying and subluxed but similar in appearance compared to 04/24/2014. These changes are likely chronic.  The lungs are clear. Cardiac and mediastinal contours are within normal limits. No acute rib fracture.  IMPRESSION: 1. No acute cardiopulmonary process. 2. No acute rib fracture. 3. Similar appearance of inferior subluxation of the left humerus compared to 04/24/2014. In the setting of prior surgical repair of the inferior glenoid, these changes are favored to be chronic.   Electronically Signed   By: Malachy Moan M.D.   On: 03/15/2015 02:26   Dg Shoulder Left  03/15/2015   CLINICAL DATA:  Status post fall, with left shoulder pain. Initial encounter.  EXAM: LEFT SHOULDER - 2+ VIEW  COMPARISON:  Left shoulder radiographs performed 04/24/2014  FINDINGS: There is no evidence of fracture or dislocation. Two screws are seen in the inferior glenoid, as on the prior study. There is mild associated bony remodeling. The left humeral head is inferiorly subluxed, likely reflecting an underlying joint effusion, or possibly some degree of joint instability.  The acromioclavicular joint is unremarkable in appearance. No significant soft tissue abnormalities are seen. The visualized portions of the left lung are clear.  IMPRESSION: 1. No evidence of fracture or dislocation. 2. Inferior subluxation of the left humeral head is perhaps slightly worsened from the prior study, and likely reflects an underlying joint effusion, or possibly some degree of underlying chronic joint instability. Postoperative change noted at the inferior glenoid.   Electronically Signed   By: Roanna Raider M.D.   On: 03/15/2015 02:26   Dg Hip Unilat With Pelvis 2-3 Views Right  03/15/2015   CLINICAL DATA:  Acute onset of right hip pain, after falling 10 feet. Initial encounter.  EXAM: RIGHT HIP (WITH PELVIS) 2-3 VIEWS  COMPARISON:  None.  FINDINGS: There appears to be chronic avascular necrosis of the right femoral head, with partial collapse of the superior aspect of the femoral head. It is difficult to determine whether the partial collapse  reflects acute injury. No focal fracture lines are seen.  Both femoral heads are seated normally within their respective acetabula. The proximal right femur appears otherwise intact. No significant degenerative change is appreciated. The sacroiliac joints are unremarkable in appearance.  The visualized bowel gas pattern is grossly unremarkable in appearance.  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 It is difficult to determine whether the partial collapse reflects acute injury; would correlate with the chronicity of the patient's symptoms.   Electronically Signed   By: Roanna Raider M.D.   On: 03/15/2015 02:28     EKG Interpretation None     x-rays have been discussed with patient.  There is no difference in today's x-ray from comparison x-rays.  He will be placed in sling and referred back to his orthopedic doctor most recently orthopedic seen in [place], Dr. Victorino Dike Patient refused pain medication  MDM   Final diagnoses:  Fall, initial encounter  Contusion, hip, right, initial encounter  Avascular necrosis of bone of hip, right  Shoulder  subluxation, right, subsequent encounter         Earley FavorGail Sophia Cubero, NP 03/15/15 0543  Cathren LaineKevin Steinl, MD 03/19/15 [phone]

## 2017-08-05 ENCOUNTER — Emergency Department (HOSPITAL_COMMUNITY)
Admission: EM | Admit: 2017-08-05 | Discharge: 2017-08-05 | Disposition: A | Discharge status: Home / Self Care | Payer: Self-pay | Attending: Emergency Medicine | Admitting: Emergency Medicine

## 2017-08-05 ENCOUNTER — Encounter (HOSPITAL_COMMUNITY): Payer: Self-pay | Admitting: *Deleted

## 2017-08-05 ENCOUNTER — Emergency Department (HOSPITAL_COMMUNITY): Payer: Self-pay

## 2017-08-05 DIAGNOSIS — X500XXA Overexertion from strenuous movement or load, initial encounter: Secondary | ICD-10-CM | POA: Insufficient documentation

## 2017-08-05 DIAGNOSIS — Y999 Unspecified external cause status: Secondary | ICD-10-CM | POA: Insufficient documentation

## 2017-08-05 DIAGNOSIS — Z79899 Other long term (current) drug therapy: Secondary | ICD-10-CM | POA: Insufficient documentation

## 2017-08-05 DIAGNOSIS — Y9389 Activity, other specified: Secondary | ICD-10-CM | POA: Insufficient documentation

## 2017-08-05 DIAGNOSIS — S43005A Unspecified dislocation of left shoulder joint, initial encounter: Secondary | ICD-10-CM | POA: Insufficient documentation

## 2017-08-05 DIAGNOSIS — Y929 Unspecified place or not applicable: Secondary | ICD-10-CM | POA: Insufficient documentation

## 2017-08-05 DIAGNOSIS — F17210 Nicotine dependence, cigarettes, uncomplicated: Secondary | ICD-10-CM | POA: Insufficient documentation

## 2017-08-05 MED ORDER — LIDOCAINE HCL 2 % IJ SOLN
20.0000 mL | Freq: Once | INTRAMUSCULAR | Status: DC
  Filled 2017-08-05: qty 20

## 2017-08-05 MED ORDER — OXYCODONE-ACETAMINOPHEN 5-325 MG PO TABS
1.0000 | ORAL_TABLET | Freq: Once | ORAL | Status: AC
  Administered 2017-08-05: 1 via ORAL

## 2017-08-05 MED ORDER — PROPOFOL 10 MG/ML IV BOLUS
0.5000 mg/kg | Freq: Once | INTRAVENOUS | Status: AC
  Administered 2017-08-05: 36 mg via INTRAVENOUS
  Filled 2017-08-05: qty 20

## 2017-08-05 MED ORDER — OXYCODONE-ACETAMINOPHEN 5-325 MG PO TABS
ORAL_TABLET | ORAL | Status: AC
  Filled 2017-08-05: qty 1
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
## 2017-08-05 NOTE — ED Notes (Signed)
Ortho paged to apply shoulder sling

## 2017-08-05 NOTE — ED Provider Notes (Signed)
.  Sedation Date/Time: 08/05/2017 3:17 PM Performed by: Adela Lank Byanca Kasper Authorized by: Melene Plan   Consent:    Consent obtained:  Written   Consent given by:  Patient   Risks discussed:  Allergic reaction, inadequate sedation, nausea, vomiting, respiratory compromise necessitating ventilatory assistance and intubation and prolonged sedation necessitating reversal   Alternatives discussed:  Analgesia without sedation and regional anesthesia Indications:    Procedure performed:  Dislocation reduction   Procedure necessitating sedation performed by:  Different physician   Intended level of sedation:  Moderate (conscious sedation) Pre-sedation assessment:    Time since last food or drink:  6 hours   NPO status caution: urgency dictates proceeding with non-ideal NPO status     ASA classification: class 2 - patient with mild systemic disease     Neck mobility: normal     Mouth opening:  3 or more finger widths   Thyromental distance:  2 finger widths   Mallampati score:  I - soft palate, uvula, fauces, pillars visible   Pre-sedation assessments completed and reviewed: airway patency, cardiovascular function, hydration status, mental status, nausea/vomiting, pain level, respiratory function and temperature   Immediate pre-procedure details:    Reassessment: Patient reassessed immediately prior to procedure     Reviewed: vital signs, relevant labs/tests and NPO status     Verified: bag valve mask available, emergency equipment available, intubation equipment available, IV patency confirmed, oxygen available and suction available   Procedure details (see MAR for exact dosages):    Preoxygenation:  Nasal cannula   Sedation:  Propofol   Intra-procedure monitoring:  Blood pressure monitoring, cardiac monitor, continuous pulse oximetry, frequent LOC assessments and frequent vital sign checks   Intra-procedure events: none     Total Provider sedation time (minutes):  35 Post-procedure details:   Post-sedation assessment completed:  08/05/2017 3:18 PM   Attendance: Constant attendance by certified staff until patient recovered     Recovery: Patient returned to pre-procedure baseline     Post-sedation assessments completed and reviewed: airway patency, cardiovascular function, hydration status, mental status, nausea/vomiting, pain level, respiratory function and temperature     Patient is stable for discharge or admission: yes     Patient tolerance:  Tolerated well, no immediate complications   Medical screening examination/treatment/procedure(s) were conducted as a shared visit with non-physician practitioner(s) and myself.  I personally evaluated the patient during the encounter.   EKG Interpretation None        See the written copy of this report in the patient's paper medical record.  These results did not interface directly into the electronic medical record and are summarized here.  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 Patient has had 4 ED visits in 2 days time. Clinically appears to be dislocated. Pulse motor and sensation is intact distally on my exam. X-ray with likely subluxation as read by the radiologist. The patient is adamant that his shoulder is out of place. When the patient was sedated shoulder went back into place. I was present for the entire procedure.  I'm unsure if the patient was holding in this position for secondary gain or not. There is some discussion about pain seeking behavior in prior documentation. Patient was discharged in a shoulder immobilizer and referred to orthopedics.    Melene Plan, DO 08/05/17 1521

## 2017-08-05 NOTE — ED Provider Notes (Signed)
[facility]-EMERGENCY DEPT Provider Note   CSN: [id number] Arrival date & time: 08/05/17  0740    History   Chief Complaint Chief Complaint  Patient presents with  . Shoulder Pain    HPI Leonard Foster is a 31 y.o. male.  HPI  31 year old male presents today with complaints of bilateral shoulder pain.  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  He notes he underwent surgery for this but continues to have him in dislocations.  Patient notes he was swinging a baseball bat yesterday when his shoulder dislocated.  He notes minor decrease in sensation in his fingers, no loss of strength.  Patient was seen yesterday at [facility] review notes that he was flipping a mattress at rehabilitation facility that caused this dislocation.  He was reduced in the ED there.  Patient notes he returned to his rehabilitation facility, they told him his shoulder was still dislocated as he had a gap between the acromion process and upper shoulder.  He notes that they will not allow him back in rehabilitation and he will be going to jail as this violates his parole.  Patient reports that he does not want narcotic pain medication, reports he just wants the shoulder reduced.   Past Medical History:  Diagnosis Date  . Femur fracture (HCC)     There are no active problems to display for this patient.   Past Surgical History:  Procedure Laterality Date  . ANKLE SURGERY    . CRANIOTOMY    . KNEE SURGERY    . SHOULDER SURGERY       Home Medications    Prior to Admission medications   Medication Sig Start Date End Date Taking? Authorizing Provider  acetaminophen (TYLENOL) 500 MG tablet Take 1,000 mg by mouth every 6 (six) hours as needed for mild pain.   Yes [provider]    Family History No family history on file.  Social History Social History  Substance Use Topics  . Smoking status: Current Some Day Smoker    Types: Cigarettes  .  Smokeless tobacco: Not on file  . Alcohol use No    Allergies   Ketamine and Zofran [ondansetron hcl]   Review of Systems Review of Systems  All other systems reviewed and are negative.    Physical Exam Updated Vital Signs BP 116/90   Pulse (!) 58   Temp 98.6 F (37 C) (Oral)   Resp 10   Ht 6' (1.829 m)   Wt 72.6 kg (160 lb)   SpO2 100%   BMI 21.70 kg/m   Physical Exam  Constitutional: He is oriented to person, place, and time. He appears well-developed and well-nourished.  HENT:  Head: Normocephalic and atraumatic.  Eyes: Pupils are equal, round, and reactive to light. Conjunctivae are normal. Right eye exhibits no discharge. Left eye exhibits no discharge. No scleral icterus.  Neck: Normal range of motion. No JVD present. No tracheal deviation present.  Pulmonary/Chest: Effort normal. No stridor.  Musculoskeletal:  Obvious space between acromion process and humerus - grip strength 5/5 sensation reduced to hand along the ulnar aspect   Neurological: He is alert and oriented to person, place, and time. Coordination normal.  Psychiatric: He has a normal mood and affect. His behavior is normal. Judgment and thought content normal.  Nursing note and vitals reviewed.   ED Treatments / Results  Labs (all labs ordered are listed, but only abnormal results are displayed) Labs Reviewed -  No data to display  EKG  EKG Interpretation None       Radiology Dg Shoulder Right  Result Date: 08/05/2017 CLINICAL DATA:  Pain following injury while playing baseball EXAM: RIGHT SHOULDER - 2+ VIEW COMPARISON:  None. FINDINGS: Frontal, oblique, and Y scapular images were obtained. 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 No acute appearing fracture or dislocation evident. There is mild narrowing of the glenohumeral joint. The acromioclavicular joint appears within normal limits. Visualized  right lung is clear. 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 No acute appearing fracture or dislocation. There is mild narrowing of the glenohumeral joint. Electronically Signed   By: Bretta Bang III M.D.   On: 08/05/2017 08:31   Dg Shoulder Left  Result Date: 08/05/2017 CLINICAL DATA:  Pain following injury while playing baseball EXAM: LEFT SHOULDER - 2+ VIEW COMPARISON:  January 14, 2015 FINDINGS: Oblique and Y scapular images obtained. There is chronic appearing inferior subluxation of the left humeral head with respect to the glenoid, stable from prior study. There is not appear to be frank dislocation. No fracture evident. There are screws transfixing the inferior glenoid. There is no appreciable joint space narrowing or erosive change. Visualized left lung is clear. IMPRESSION: Chronic appearing inferior subluxation of the left humeral head with respect to the glenoid, essentially stable appearance compared to prior study. No frank dislocation. No fracture. No appreciable joint space narrowing or erosion. Postoperative change in the inferior glenoid. Electronically Signed   By: Bretta Bang III M.D.   On: 08/05/2017 08:30   Dg Shoulder Left Portable  Result Date: 08/05/2017 CLINICAL DATA:  Post reduction EXAM: LEFT SHOULDER - 1 VIEW COMPARISON:  Earlier today FINDINGS: There is now anatomic alignment of the humeral head with respect to the glenoid. Postop changes at the inferior glenoid are stable. No acute fracture. No dislocation. IMPRESSION: Anatomic reduction of the glenohumeral joint has been achieved. Electronically Signed   By: Jolaine Click M.D.   On: 08/05/2017 14:46    Procedures Procedures (including critical care time)  Reduction of dislocation Date/Time: 3:14 PM Performed by: Thermon Leyland Authorized by: Thermon Leyland Consent: Verbal consent obtained. Risks and benefits: risks, benefits and alternatives were  discussed Consent given by: patient Required items: required blood products, implants, devices, and special equipment available Time out: Immediately prior to procedure a "time out" was called to verify the correct patient, procedure, equipment, support staff and site/side marked as required.  Patient sedated: Patient sedated using propofol  Vitals: Vital signs were monitored during sedation. Patient tolerance: Patient tolerated the procedure well with no immediate complications. Joint: left shoulder   Reduction technique: Upon sedation joint was reduced likely secondary to muscular relaxation    SPLINT APPLICATION Date/Time: 3:14 PM Authorized by: Thermon Leyland Consent: Verbal consent obtained. Risks and benefits: risks, benefits and alternatives were discussed Consent given by: patient Splint applied by: orthopedic technician Location details: left shoudler Splint type: Sling immobilizer Supplies used: Sling immobilizer Post-procedure: The splinted body part was neurovascularly unchanged following the procedure. Patient tolerance: Patient tolerated the procedure well with no immediate complications.   Medications Ordered in ED Medications  lidocaine (XYLOCAINE) 2 % (with pres) injection 400 mg (400 mg Infiltration Refused 08/05/17 1441)  oxyCODONE-acetaminophen (PERCOCET/ROXICET) 5-325 MG per tablet 1 tablet (1 tablet Oral Given 08/05/17 0754)  propofol (DIPRIVAN) 10 mg/mL bolus/IV push 36.3 mg (36 mg  Intravenous Given 08/05/17 1417)     Initial Impression / Assessment and Plan / ED Course  I have reviewed the triage vital signs and the nursing notes.  Pertinent labs & imaging results that were available during my care of the patient were reviewed by me and considered in my medical decision making (see chart for details).      Final Clinical Impressions(s) / ED Diagnoses   Final diagnoses:  Dislocation of left shoulder joint, initial encounter    Imaging: DG  shoulder right, DG shoulder left   Therapeutics: Propofol  Discharge Meds:   Assessment/Plan: 31 year old male presents today with shoulder dislocation.  Imaging shows inferior subluxation, clinically he has a large defect At the acromium and humeral head with reported numbness in his hand.  Attempted reduction will be performed here in the ED.    New Prescriptions New Prescriptions   No medications on file     Rosalio Loud 08/05/17 1514    Melene Plan, DO 08/05/17 1521

## 2017-08-05 NOTE — Progress Notes (Signed)
Orthopedic Tech Progress Note Patient Details:  Leonard Foster 28-Oct-1986 [id number]      [name] 08/05/2017, 3:10 PM

## 2017-08-05 NOTE — Discharge Instructions (Signed)
Please read attached information. If you experience any new or worsening signs or symptoms please return to the emergency room for evaluation. Please follow-up with your primary care provider or specialist as discussed.  °

## 2018-03-04 ENCOUNTER — Emergency Department: Admit: 2018-03-04 | Payer: TRICARE (CHAMPUS)

## 2018-03-04 ENCOUNTER — Inpatient Hospital Stay
Admit: 2018-03-04 | Discharge: 2018-03-04 | Disposition: A | Discharge status: Home / Self Care | Payer: TRICARE (CHAMPUS) | Attending: Emergency Medicine

## 2018-03-04 DIAGNOSIS — M25512 Pain in left shoulder: Secondary | ICD-10-CM

## 2018-03-04 MED ORDER — ONDANSETRON (PF) 4 MG/2 ML INJECTION
4 mg/2 mL | INTRAMUSCULAR | Status: AC
Start: 2018-03-04 — End: 2018-03-04
  Administered 2018-03-04: 08:00:00 via INTRAVENOUS

## 2018-03-04 MED ORDER — NAPROXEN 500 MG TAB
500 mg | ORAL_TABLET | Freq: Two times a day (BID) | ORAL | 0 refills | Status: AC
Start: 2018-03-04 — End: 2018-03-14

## 2018-03-04 MED ORDER — KETOROLAC TROMETHAMINE 30 MG/ML INJECTION
30 mg/mL (1 mL) | INTRAMUSCULAR | Status: AC
Start: 2018-03-04 — End: 2018-03-04
  Administered 2018-03-04: 10:00:00 via INTRAVENOUS

## 2018-03-04 MED ORDER — HYDROMORPHONE (PF) 1 MG/ML IJ SOLN
1 mg/mL | INTRAMUSCULAR | Status: AC
Start: 2018-03-04 — End: 2018-03-04
  Administered 2018-03-04: 09:00:00 via INTRAVENOUS

## 2018-03-04 MED ORDER — HYDROMORPHONE (PF) 1 MG/ML IJ SOLN
1 mg/mL | INTRAMUSCULAR | Status: AC
Start: 2018-03-04 — End: 2018-03-04
  Administered 2018-03-04: 08:00:00 via INTRAVENOUS

## 2018-03-04 MED FILL — HYDROMORPHONE (PF) 1 MG/ML IJ SOLN: 1 mg/mL | INTRAMUSCULAR | Qty: 1

## 2018-03-04 MED FILL — KETOROLAC TROMETHAMINE 30 MG/ML INJECTION: 30 mg/mL (1 mL) | INTRAMUSCULAR | Qty: 1

## 2018-03-04 MED FILL — ONDANSETRON (PF) 4 MG/2 ML INJECTION: 4 mg/2 mL | INTRAMUSCULAR | Qty: 2

## 2018-03-04 NOTE — ED Triage Notes (Signed)
Patient comes in from home. He reports his dog pulled him down the steps this morning and he used his LEFT arm to catch his balance. Patient unable to move LEFT arm at all. Reports hearing a pop.

## 2018-03-04 NOTE — ED Provider Notes (Signed)
The history is provided by the patient.   Shoulder Dislocation    The incident occurred less than 1 hour ago. The incident occurred at home. The injury mechanism was a fall (The patient going outside to take his dog out when the dog pulled and caused him to fall.). The left shoulder is affected. The pain is at a severity of 10/10. The pain is severe. The pain has been constant since onset. The pain does not radiate. There is a history of shoulder injury. He has no other injuries. There is a history of shoulder surgery (The patient has hx glenoid surgery in highschool by a Dr. Jenne P[place]Bates out of state. ). Pertinent negatives include no numbness, no muscle weakness and no tingling.   The patient denies history of shoulder dislocation.  He is R handed.  The patient states his wife drove him to the ED and is in the parking lot with their son.      Past Medical History:   Diagnosis Date   ??? Hypertension        Past Surgical History:   Procedure Laterality Date   ??? NEUROLOGICAL PROCEDURE UNLISTED           No family history on file.    Social History     Socioeconomic History   ??? Marital status: MARRIED     Spouse name: Not on file   ??? Number of children: Not on file   ??? Years of education: Not on file   ??? Highest education level: Not on file   Occupational History   ??? Not on file   Social Needs   ??? Financial resource strain: Not on file   ??? Food insecurity:     Worry: Not on file     Inability: Not on file   ??? Transportation needs:     Medical: Not on file     Non-medical: Not on file   Tobacco Use   ??? Smoking status: Current Every Day Smoker     Packs/day: 1.00   ??? Smokeless tobacco: Never Used   Substance and Sexual Activity   ??? Alcohol use: Never     Frequency: Never   ??? Drug use: Not on file   ??? Sexual activity: Not on file   Lifestyle   ??? Physical activity:     Days per week: Not on file     Minutes per session: Not on file   ??? Stress: Not on file   Relationships   ??? Social connections:     Talks on phone: Not on file      Gets together: Not on file     Attends religious service: Not on file     Active member of club or organization: Not on file     Attends meetings of clubs or organizations: Not on file     Relationship status: Not on file   ??? Intimate partner violence:     Fear of current or ex partner: Not on file     Emotionally abused: Not on file     Physically abused: Not on file     Forced sexual activity: Not on file   Other Topics Concern   ??? Not on file   Social History Narrative   ??? Not on file         ALLERGIES: Patient has no known allergies.    Review of Systems   Constitutional: Negative for appetite change and fever.   HENT: Negative  for congestion, nosebleeds and sore throat.    Eyes: Negative for discharge and visual disturbance.   Respiratory: Negative for cough and shortness of breath.    Cardiovascular: Negative for chest pain.   Gastrointestinal: Negative for abdominal pain, diarrhea, nausea and vomiting.   Genitourinary: Negative for dysuria.   Musculoskeletal:        L shoulder pain   Skin: Negative for rash.   Neurological: Negative for tingling, weakness, numbness and headaches.   Hematological: Negative for adenopathy.   Psychiatric/Behavioral: Negative.    All other systems reviewed and are negative.      Vitals:    03/04/18 0430 03/04/18 0500 03/04/18 0626   BP: 139/84 116/61 112/68   Pulse: 81 62 65   Resp: 24 18 16    Temp:  98.4 ??F (36.9 ??C) 98.5 ??F (36.9 ??C)   SpO2: 96% 96% 97%            Physical Exam   Constitutional: He is oriented to person, place, and time. He appears well-developed and well-nourished. He appears distressed.   HENT:   Head: Normocephalic and atraumatic.   Right Ear: External ear normal.   Mouth/Throat: Oropharynx is clear and moist.   Eyes: Conjunctivae are normal.   Neck: Normal range of motion. Neck supple.   Cardiovascular: Normal rate, regular rhythm and normal heart sounds.   Pulmonary/Chest: Effort normal and breath sounds normal.    Abdominal: Soft. Bowel sounds are normal. There is no tenderness.   Musculoskeletal: He exhibits no edema or tenderness.   L arm: the humerus appears to be in a lower position and the scapula is protruding. The patient resists any movement of the shoulder. Neurovascularly intact.   Neurological: He is alert and oriented to person, place, and time.   Skin: Skin is warm and dry.   Psychiatric: He has a normal mood and affect. His behavior is normal.   Nursing note and vitals reviewed.       MDM       Procedures    Review of records from Care Everywhere reveals that the patient has hx chronic L shoulder subluxation. He has been seen numerous times for possible shoulder dislocations and shoulder pain. He has exhibited drug seeking behavior.     When the patient wasn't being watched, he was seen moving the L shoulder.    5:30 AM  CONSULT:  Dr. Annell Greening, MD - ortho - discussion of hx, x-ray results, and images provided. He recommends axillary view, if no dislocation, he may be discharged and follow-up with Dr. Artis Flock.    The patient's results were reviewed with him. He then called a cab (he had previously stated his wife drove and was in the parking lot). He was able to get dressed without assistance.    A/P:  1. Chronic L shoulder pain - hx subluxation. F/u with ortho.  2. Drug seeking behavior -changed story provided to MD multiple times during stay        This note will not be viewable in MyChart.

## 2018-03-04 NOTE — ED Notes (Signed)
Patient has been medically cleared for discharge at this time. Given all discharge instructions at this time. Patient called a cab from the room and asked RN for address so he could get picked up. Patient was able to move his shoulder in order to put on his shirt and call his cab. Patient seen leaving the department with a steady gait and all belongings.

## 2019-05-17 IMAGING — DX DG SHOULDER 1V*L*
2 series · 2 of 2 positions shown · non-contrast
Comparison: Earlier today

CLINICAL DATA: Post reduction

EXAM:
LEFT SHOULDER - 1 VIEW

[shoulder ap]
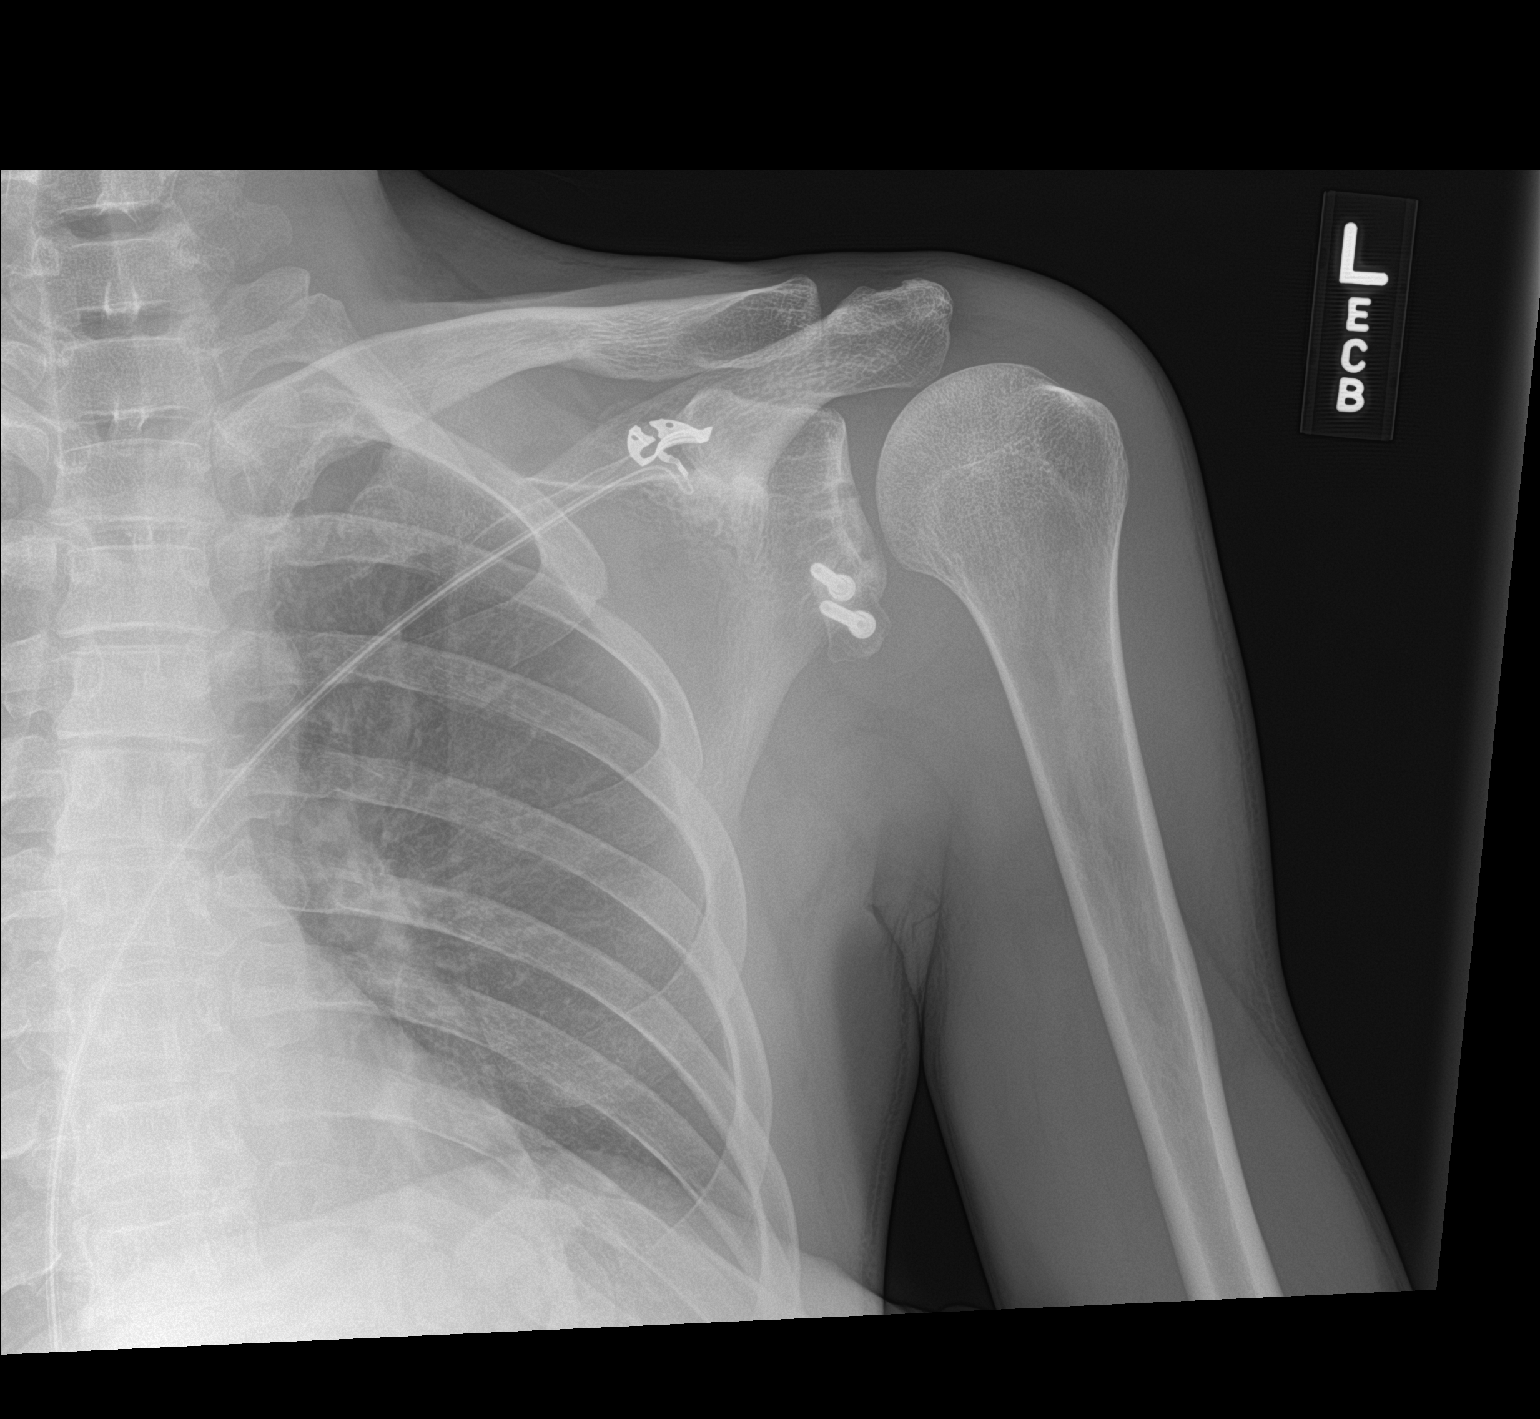

[shoulder obl]
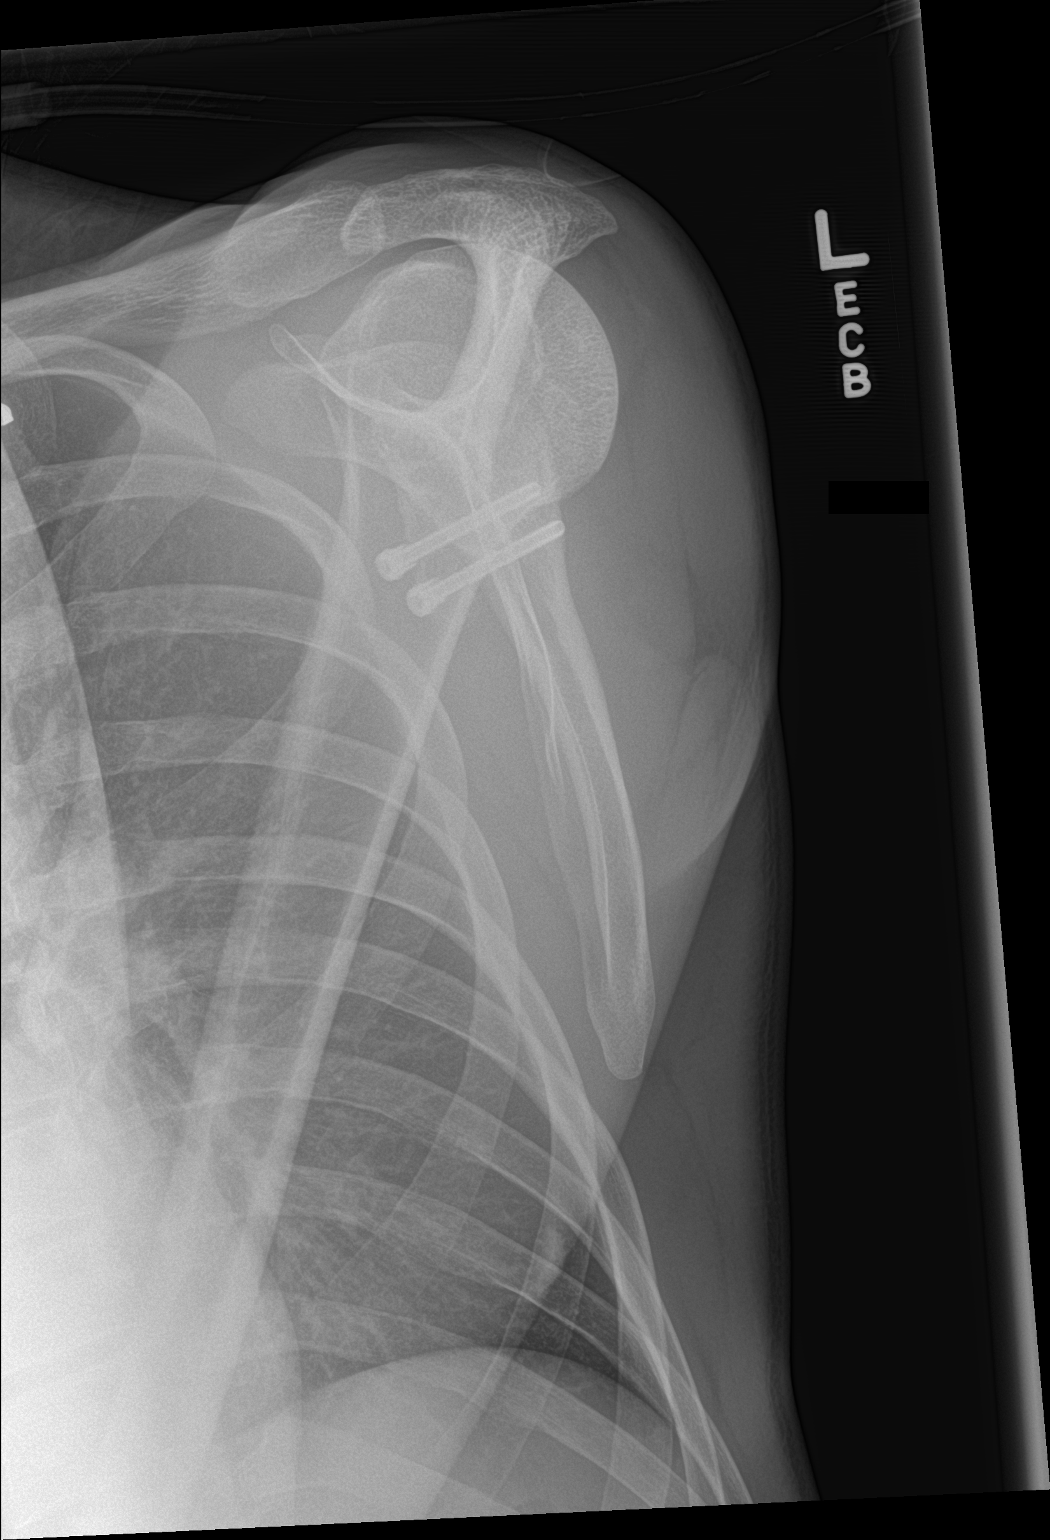

[2 of 2 positions shown; findings below may reference images not displayed]

FINDINGS: There is now anatomic alignment of the humeral head with respect to
the glenoid. Postop changes at the inferior glenoid are stable. No
acute fracture. No dislocation.
IMPRESSION: Anatomic reduction of the glenohumeral joint has been achieved.

## 2020-11-18 DEATH — deceased
